# Patient Record
Sex: Female | Born: 1950
Health system: Southern US, Community
[De-identification: ages and names within clinical notes are randomized; demographics above are authoritative.]

## PROBLEM LIST (undated history)

## (undated) DIAGNOSIS — E119 Type 2 diabetes mellitus without complications: Secondary | ICD-10-CM

## (undated) DIAGNOSIS — E785 Hyperlipidemia, unspecified: Secondary | ICD-10-CM

## (undated) HISTORY — PX: ROTATOR CUFF REPAIR: SHX139

## (undated) HISTORY — PX: BREAST BIOPSY: SHX20

## (undated) HISTORY — PX: ABDOMINAL HYSTERECTOMY: SHX81

## (undated) HISTORY — PX: APPENDECTOMY: SHX54

## (undated) HISTORY — DX: Hyperlipidemia, unspecified: E78.5

## (undated) HISTORY — PX: TONSILLECTOMY: SUR1361

---

## 2002-05-18 ENCOUNTER — Encounter: Admission: RE | Admit: 2002-05-18 | Discharge: 2002-05-18 | Payer: Self-pay | Admitting: Obstetrics and Gynecology

## 2002-05-18 ENCOUNTER — Encounter: Payer: Self-pay | Admitting: Obstetrics and Gynecology

## 2005-12-05 ENCOUNTER — Emergency Department (HOSPITAL_COMMUNITY): Admission: EM | Admit: 2005-12-05 | Discharge: 2005-12-05 | Payer: Self-pay | Admitting: Family Medicine

## 2007-01-16 ENCOUNTER — Encounter: Admission: RE | Admit: 2007-01-16 | Discharge: 2007-01-16 | Payer: Self-pay | Admitting: *Deleted

## 2007-07-21 ENCOUNTER — Emergency Department (HOSPITAL_COMMUNITY): Admission: EM | Admit: 2007-07-21 | Discharge: 2007-07-21 | Payer: Self-pay | Admitting: Emergency Medicine

## 2009-06-29 ENCOUNTER — Encounter: Admission: RE | Admit: 2009-06-29 | Discharge: 2009-06-29 | Payer: Self-pay | Admitting: Family Medicine

## 2011-06-15 ENCOUNTER — Other Ambulatory Visit: Payer: Self-pay | Admitting: Family Medicine

## 2011-06-15 DIAGNOSIS — Z1231 Encounter for screening mammogram for malignant neoplasm of breast: Secondary | ICD-10-CM

## 2011-06-21 ENCOUNTER — Ambulatory Visit
Admission: RE | Admit: 2011-06-21 | Discharge: 2011-06-21 | Disposition: A | Discharge status: Home / Self Care | Payer: BC Managed Care – PPO | Source: Ambulatory Visit | Attending: Family Medicine | Admitting: Family Medicine

## 2011-06-21 DIAGNOSIS — Z1231 Encounter for screening mammogram for malignant neoplasm of breast: Secondary | ICD-10-CM

## 2013-02-25 ENCOUNTER — Other Ambulatory Visit: Payer: Self-pay

## 2013-02-25 DIAGNOSIS — Z1231 Encounter for screening mammogram for malignant neoplasm of breast: Secondary | ICD-10-CM

## 2013-03-09 ENCOUNTER — Ambulatory Visit
Admission: RE | Admit: 2013-03-09 | Discharge: 2013-03-09 | Disposition: A | Discharge status: Home / Self Care | Payer: BC Managed Care – PPO | Source: Ambulatory Visit

## 2013-03-09 DIAGNOSIS — Z1231 Encounter for screening mammogram for malignant neoplasm of breast: Secondary | ICD-10-CM

## 2013-06-11 ENCOUNTER — Other Ambulatory Visit: Payer: Self-pay | Admitting: Family Medicine

## 2013-06-11 DIAGNOSIS — N63 Unspecified lump in unspecified breast: Secondary | ICD-10-CM

## 2013-06-23 ENCOUNTER — Ambulatory Visit
Admission: RE | Admit: 2013-06-23 | Discharge: 2013-06-23 | Disposition: A | Discharge status: Home / Self Care | Payer: BC Managed Care – PPO | Source: Ambulatory Visit | Attending: Family Medicine | Admitting: Family Medicine

## 2013-06-23 DIAGNOSIS — N63 Unspecified lump in unspecified breast: Secondary | ICD-10-CM

## 2015-01-19 ENCOUNTER — Encounter: Payer: Self-pay | Admitting: Podiatrist

## 2015-01-19 ENCOUNTER — Ambulatory Visit (INDEPENDENT_AMBULATORY_CARE_PROVIDER_SITE_OTHER): Payer: 59 | Admitting: Podiatrist

## 2015-01-19 VITALS — BP 153/77 | HR 76 | Resp 12

## 2015-01-19 DIAGNOSIS — B351 Tinea unguium: Secondary | ICD-10-CM

## 2015-01-19 MED ORDER — EFINACONAZOLE 10 % EX SOLN: 1.0000 [drp] | Freq: Every day | CUTANEOUS | Status: AC

## 2015-01-19 NOTE — Patient Instructions (Signed)

## 2015-01-19 NOTE — Progress Notes (Signed)
   Subjective:    Patient ID: Susan Blackburn, female    DOB: January 24, 1951, 64 y.o.   MRN: [id number]  HPI PT STATED RT FOOT GREAT TOENAIL HAVE DISCOLORATION AND SOMETIMES IS SORE FOR 7 YEARS. THE TOENAIL IS BEEN THE SAME AND SOMETIMES IS WORSE. TRIED OTC ANTIFUNGAL BUT NO HELP. She previously had the toenail lasered by Dr. Janus Molder and she states that it grew back with the fungus still present. She would like to know her options.  Review of Systems  All other systems reviewed and are negative.      Objective:   Physical Exam  Neurovascular status is intact. Right great toenail is yellowish brownish discoloration, thickened and dystrophic-appearing. Medial aspect of the right second toenail also appears to have some mycotic appearance is well.      Assessment & Plan:  Onychomycosis right hallux nail  Plan: Recommended taking a sample of the nail for pathology and deciding upon treatment at that time. Did recommend starting on Jublia and a prescription was written. We'll consider oral options based on culture results.

## 2015-02-10 ENCOUNTER — Other Ambulatory Visit: Payer: Self-pay

## 2015-02-10 DIAGNOSIS — Z1231 Encounter for screening mammogram for malignant neoplasm of breast: Secondary | ICD-10-CM

## 2015-02-17 ENCOUNTER — Telehealth: Payer: Self-pay | Admitting: *Deleted

## 2015-02-17 ENCOUNTER — Ambulatory Visit
Admission: RE | Admit: 2015-02-17 | Discharge: 2015-02-17 | Disposition: A | Discharge status: Home / Self Care | Payer: 59 | Source: Ambulatory Visit

## 2015-02-17 ENCOUNTER — Encounter: Payer: Self-pay | Admitting: Podiatrist

## 2015-02-17 DIAGNOSIS — Z1231 Encounter for screening mammogram for malignant neoplasm of breast: Secondary | ICD-10-CM

## 2015-02-17 NOTE — Telephone Encounter (Signed)
I left a message for patient to give me a call back.  Per Dr. Valentina Lucks, culture came back positive for fungus. Patient can continue topical and a prescription for Lamisil can be prescribed which will be most effective.

## 2015-05-19 NOTE — Telephone Encounter (Signed)
I left messages for patient to give me a call back.  I need to inform her of culture results.

## 2015-11-02 ENCOUNTER — Other Ambulatory Visit: Payer: Self-pay | Admitting: Family Medicine

## 2015-11-02 DIAGNOSIS — N63 Unspecified lump in unspecified breast: Secondary | ICD-10-CM

## 2015-11-10 ENCOUNTER — Ambulatory Visit
Admission: RE | Admit: 2015-11-10 | Discharge: 2015-11-10 | Disposition: A | Discharge status: Home / Self Care | Payer: 59 | Source: Ambulatory Visit | Attending: Family Medicine | Admitting: Family Medicine

## 2015-11-10 DIAGNOSIS — N63 Unspecified lump in unspecified breast: Secondary | ICD-10-CM

## 2016-04-20 ENCOUNTER — Other Ambulatory Visit: Payer: Self-pay

## 2016-04-20 DIAGNOSIS — Z1231 Encounter for screening mammogram for malignant neoplasm of breast: Secondary | ICD-10-CM

## 2016-07-02 ENCOUNTER — Ambulatory Visit
Admission: RE | Admit: 2016-07-02 | Discharge: 2016-07-02 | Disposition: A | Discharge status: Home / Self Care | Payer: 59 | Source: Ambulatory Visit

## 2016-07-02 DIAGNOSIS — Z1231 Encounter for screening mammogram for malignant neoplasm of breast: Secondary | ICD-10-CM

## 2016-12-15 DIAGNOSIS — Z23 Encounter for immunization: Secondary | ICD-10-CM | POA: Diagnosis not present

## 2017-05-10 DIAGNOSIS — E782 Mixed hyperlipidemia: Secondary | ICD-10-CM | POA: Diagnosis not present

## 2017-05-10 DIAGNOSIS — R030 Elevated blood-pressure reading, without diagnosis of hypertension: Secondary | ICD-10-CM | POA: Diagnosis not present

## 2017-05-10 DIAGNOSIS — K219 Gastro-esophageal reflux disease without esophagitis: Secondary | ICD-10-CM | POA: Diagnosis not present

## 2017-05-10 DIAGNOSIS — Z23 Encounter for immunization: Secondary | ICD-10-CM | POA: Diagnosis not present

## 2017-05-10 DIAGNOSIS — Z85828 Personal history of other malignant neoplasm of skin: Secondary | ICD-10-CM | POA: Diagnosis not present

## 2017-05-10 DIAGNOSIS — Z1389 Encounter for screening for other disorder: Secondary | ICD-10-CM | POA: Diagnosis not present

## 2017-05-10 DIAGNOSIS — E559 Vitamin D deficiency, unspecified: Secondary | ICD-10-CM | POA: Diagnosis not present

## 2017-05-10 DIAGNOSIS — R7309 Other abnormal glucose: Secondary | ICD-10-CM | POA: Diagnosis not present

## 2017-05-10 DIAGNOSIS — R635 Abnormal weight gain: Secondary | ICD-10-CM | POA: Diagnosis not present

## 2017-05-10 DIAGNOSIS — Z833 Family history of diabetes mellitus: Secondary | ICD-10-CM | POA: Diagnosis not present

## 2017-05-10 DIAGNOSIS — M8588 Other specified disorders of bone density and structure, other site: Secondary | ICD-10-CM | POA: Diagnosis not present

## 2017-05-10 DIAGNOSIS — Z0000 Encounter for general adult medical examination without abnormal findings: Secondary | ICD-10-CM | POA: Diagnosis not present

## 2017-05-16 DIAGNOSIS — Z85828 Personal history of other malignant neoplasm of skin: Secondary | ICD-10-CM | POA: Diagnosis not present

## 2017-05-16 DIAGNOSIS — L570 Actinic keratosis: Secondary | ICD-10-CM | POA: Diagnosis not present

## 2017-05-16 DIAGNOSIS — L821 Other seborrheic keratosis: Secondary | ICD-10-CM | POA: Diagnosis not present

## 2017-05-16 DIAGNOSIS — D485 Neoplasm of uncertain behavior of skin: Secondary | ICD-10-CM | POA: Diagnosis not present

## 2017-05-16 DIAGNOSIS — D1801 Hemangioma of skin and subcutaneous tissue: Secondary | ICD-10-CM | POA: Diagnosis not present

## 2017-05-16 DIAGNOSIS — L814 Other melanin hyperpigmentation: Secondary | ICD-10-CM | POA: Diagnosis not present

## 2017-05-16 DIAGNOSIS — L439 Lichen planus, unspecified: Secondary | ICD-10-CM | POA: Diagnosis not present

## 2017-09-10 DIAGNOSIS — Z23 Encounter for immunization: Secondary | ICD-10-CM | POA: Diagnosis not present

## 2017-11-20 DIAGNOSIS — H2511 Age-related nuclear cataract, right eye: Secondary | ICD-10-CM | POA: Diagnosis not present

## 2017-11-20 DIAGNOSIS — H5213 Myopia, bilateral: Secondary | ICD-10-CM | POA: Diagnosis not present

## 2018-05-12 ENCOUNTER — Other Ambulatory Visit: Payer: Self-pay | Admitting: Family Medicine

## 2018-05-12 DIAGNOSIS — Z1231 Encounter for screening mammogram for malignant neoplasm of breast: Secondary | ICD-10-CM

## 2018-05-16 DIAGNOSIS — K219 Gastro-esophageal reflux disease without esophagitis: Secondary | ICD-10-CM | POA: Diagnosis not present

## 2018-05-16 DIAGNOSIS — Z1159 Encounter for screening for other viral diseases: Secondary | ICD-10-CM | POA: Diagnosis not present

## 2018-05-16 DIAGNOSIS — M8588 Other specified disorders of bone density and structure, other site: Secondary | ICD-10-CM | POA: Diagnosis not present

## 2018-05-16 DIAGNOSIS — R7303 Prediabetes: Secondary | ICD-10-CM | POA: Diagnosis not present

## 2018-05-16 DIAGNOSIS — Z136 Encounter for screening for cardiovascular disorders: Secondary | ICD-10-CM | POA: Diagnosis not present

## 2018-05-16 DIAGNOSIS — Z85828 Personal history of other malignant neoplasm of skin: Secondary | ICD-10-CM | POA: Diagnosis not present

## 2018-05-16 DIAGNOSIS — Z0000 Encounter for general adult medical examination without abnormal findings: Secondary | ICD-10-CM | POA: Diagnosis not present

## 2018-05-16 DIAGNOSIS — E559 Vitamin D deficiency, unspecified: Secondary | ICD-10-CM | POA: Diagnosis not present

## 2018-05-16 DIAGNOSIS — Z862 Personal history of diseases of the blood and blood-forming organs and certain disorders involving the immune mechanism: Secondary | ICD-10-CM | POA: Diagnosis not present

## 2018-05-16 DIAGNOSIS — E782 Mixed hyperlipidemia: Secondary | ICD-10-CM | POA: Diagnosis not present

## 2018-05-16 DIAGNOSIS — R609 Edema, unspecified: Secondary | ICD-10-CM | POA: Diagnosis not present

## 2018-05-16 DIAGNOSIS — Z1331 Encounter for screening for depression: Secondary | ICD-10-CM | POA: Diagnosis not present

## 2018-06-09 ENCOUNTER — Ambulatory Visit: Payer: 59

## 2018-07-07 ENCOUNTER — Ambulatory Visit
Admission: RE | Admit: 2018-07-07 | Discharge: 2018-07-07 | Disposition: A | Discharge status: Home / Self Care | Payer: Medicare Other | Source: Ambulatory Visit | Attending: Family Medicine | Admitting: Family Medicine

## 2018-07-07 DIAGNOSIS — Z1231 Encounter for screening mammogram for malignant neoplasm of breast: Secondary | ICD-10-CM | POA: Diagnosis not present

## 2018-08-07 DIAGNOSIS — D123 Benign neoplasm of transverse colon: Secondary | ICD-10-CM | POA: Diagnosis not present

## 2018-08-07 DIAGNOSIS — Z1211 Encounter for screening for malignant neoplasm of colon: Secondary | ICD-10-CM | POA: Diagnosis not present

## 2018-08-07 DIAGNOSIS — Z800 Family history of malignant neoplasm of digestive organs: Secondary | ICD-10-CM | POA: Diagnosis not present

## 2018-08-07 DIAGNOSIS — K5730 Diverticulosis of large intestine without perforation or abscess without bleeding: Secondary | ICD-10-CM | POA: Diagnosis not present

## 2018-08-12 DIAGNOSIS — D123 Benign neoplasm of transverse colon: Secondary | ICD-10-CM | POA: Diagnosis not present

## 2018-08-12 DIAGNOSIS — Z1211 Encounter for screening for malignant neoplasm of colon: Secondary | ICD-10-CM | POA: Diagnosis not present

## 2018-08-29 DIAGNOSIS — E782 Mixed hyperlipidemia: Secondary | ICD-10-CM | POA: Diagnosis not present

## 2018-08-29 DIAGNOSIS — R7303 Prediabetes: Secondary | ICD-10-CM | POA: Diagnosis not present

## 2018-08-29 DIAGNOSIS — E559 Vitamin D deficiency, unspecified: Secondary | ICD-10-CM | POA: Diagnosis not present

## 2018-09-04 DIAGNOSIS — Z23 Encounter for immunization: Secondary | ICD-10-CM | POA: Diagnosis not present

## 2018-10-02 DIAGNOSIS — M8588 Other specified disorders of bone density and structure, other site: Secondary | ICD-10-CM | POA: Diagnosis not present

## 2018-10-17 ENCOUNTER — Encounter: Payer: Medicare Other | Attending: Family Medicine | Admitting: Registered"

## 2018-10-17 ENCOUNTER — Encounter: Payer: Self-pay | Admitting: Registered"

## 2018-10-17 DIAGNOSIS — Z713 Dietary counseling and surveillance: Secondary | ICD-10-CM | POA: Insufficient documentation

## 2018-10-17 DIAGNOSIS — E119 Type 2 diabetes mellitus without complications: Secondary | ICD-10-CM | POA: Insufficient documentation

## 2018-10-17 NOTE — Patient Instructions (Addendum)
Consider going to bed earlier, catch the melatonin wave Consider getting in 1 or 2 days more of exercise Consider drinking more water. Get up more often when at work. Consider adding protein to your smoothies If you are going to have some ice cream, have closer to dinner and less carbs in the meal. Continue with the reduction in carbohydrates like you're doing.

## 2018-10-17 NOTE — Progress Notes (Signed)
Diabetes Self-Management Education  Visit Type: First/Initial  Appt. Start Time: 1025 Appt. End Time: 1130  10/17/2018  [name], identified by name and date of birth, is a 67 y.o. female with a diagnosis of Diabetes: Type 2.   ASSESSMENT Pt states she has kept her A1c in the prediabetes range for a long time and would like help in being able to reduce A1c below the diabetes range. Patient had questions about DM medications, but would like to avoid having to take meds for DM.  Pt states prior to diagnosis she was drinking more Dr. Malachi Bonds and eating ice cream 2-3x per week. Since diagnosis she has reduced her ice cream intake and cut out dr pepper and pasta.  Sleep: 5-6 hours weekdays, 7-8 on weekends. Doesn't want to get up in morning to go to work, sleeps in on weekends. Pt states since college she got in the habit of staying up late at night.  Stress level: 3-4/10 was high when husband had cancer and passed away 7 yrs ago. Pt is sad about her daughter and grandchildren moving to [place]. 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  Diabetes Self-Management Education - 10/17/18 1029      Visit Information   Visit Type  First/Initial      Initial Visit   Diabetes Type  Type 2    Are you currently following a meal plan?  No    Are you taking your medications as prescribed?  Not on Medications    Date Diagnosed  2019      Health Coping   How would you rate your overall health?  Good      Psychosocial Assessment   Patient Belief/Attitude about Diabetes  Motivated to manage diabetes    How often do you need to have someone help you when you read instructions, pamphlets, or other written materials from your doctor or pharmacy?  1 - Never    What is the last grade level you completed in school?  [employer]      Complications   Last HgB A1C per patient/outside source  6.6 %    How often do you check  your blood sugar?  0 times/day (not testing)    Have you had a dilated eye exam in the past 12 months?  Yes    Have you had a dental exam in the past 12 months?  Yes    Are you checking your feet?  No      Dietary Intake   Breakfast  granola bar, premier protein     Snack (morning)  none    Lunch  soup in a thermose, gold fish    Snack (afternoon)  when energy is low aroudn 3:30 or 4; tomatoes & cucumbers or rice cakes    Dinner  good - chicken or salmon, broccoli or cauliflower rice or salad, almond milk    Snack (evening)  ice cream 2-3x in last month.    Beverage(s)  water, almond milk, protein drink, hot chocolate when cold in office      Exercise   Exercise Type  Light (walking / raking leaves)    How many days per week to you exercise?  2    How many minutes per day do you exercise?  45    Total minutes per week of exercise  90      Patient Education  Previous Diabetes Education  No    Disease state   Definition of diabetes, type 1 and 2, and the diagnosis of diabetes    Nutrition management   Role of diet in the treatment of diabetes and the relationship between the three main macronutrients and blood glucose level;Carbohydrate counting;Food label reading, portion sizes and measuring food.    Physical activity and exercise   Role of exercise on diabetes management, blood pressure control and cardiac health.    Medications  Other (comment)   answered questons about metformin and insulin   Psychosocial adjustment  Role of stress on diabetes      Individualized Goals (developed by patient)   Nutrition  General guidelines for healthy choices and portions discussed    Physical Activity  Exercise 3-5 times per week    Reducing Risk  --   get 7-9 hours sleep     Outcomes   Expected Outcomes  Demonstrated interest in learning. Expect positive outcomes    Future DMSE  PRN    Program Status  Completed      Individualized Plan for Diabetes Self-Management Training:   Learning  Objective:  Patient will have a greater understanding of diabetes self-management. Patient education plan is to attend individual and/or group sessions per assessed needs and concerns.   Patient Instructions  Consider going to bed earlier, catch the melatonin wave Consider getting in 1 or 2 days more of exercise Consider drinking more water. Get up more often when at work. Consider adding protein to your smoothies If you are going to have some ice cream, have closer to dinner and less carbs in the meal. Continue with the reduction in carbohydrates like you're doing.  Expected Outcomes:  Demonstrated interest in learning. Expect positive outcomes  Education material provided: A1C conversion sheet and My Plate, sleep hygiene  If problems or questions, patient to contact team via:  Phone  Future DSME appointment: PRN

## 2019-01-02 DIAGNOSIS — H5213 Myopia, bilateral: Secondary | ICD-10-CM | POA: Diagnosis not present

## 2019-01-02 DIAGNOSIS — Z961 Presence of intraocular lens: Secondary | ICD-10-CM | POA: Diagnosis not present

## 2019-01-02 DIAGNOSIS — H2511 Age-related nuclear cataract, right eye: Secondary | ICD-10-CM | POA: Diagnosis not present

## 2019-01-07 DIAGNOSIS — J209 Acute bronchitis, unspecified: Secondary | ICD-10-CM | POA: Diagnosis not present

## 2019-05-22 DIAGNOSIS — Z0000 Encounter for general adult medical examination without abnormal findings: Secondary | ICD-10-CM | POA: Diagnosis not present

## 2019-05-22 DIAGNOSIS — Z6841 Body Mass Index (BMI) 40.0 and over, adult: Secondary | ICD-10-CM | POA: Diagnosis not present

## 2019-05-28 DIAGNOSIS — E559 Vitamin D deficiency, unspecified: Secondary | ICD-10-CM | POA: Diagnosis not present

## 2019-05-28 DIAGNOSIS — E119 Type 2 diabetes mellitus without complications: Secondary | ICD-10-CM | POA: Diagnosis not present

## 2019-05-28 DIAGNOSIS — E782 Mixed hyperlipidemia: Secondary | ICD-10-CM | POA: Diagnosis not present

## 2019-06-01 DIAGNOSIS — E782 Mixed hyperlipidemia: Secondary | ICD-10-CM | POA: Diagnosis not present

## 2019-06-01 DIAGNOSIS — E559 Vitamin D deficiency, unspecified: Secondary | ICD-10-CM | POA: Diagnosis not present

## 2019-06-01 DIAGNOSIS — K219 Gastro-esophageal reflux disease without esophagitis: Secondary | ICD-10-CM | POA: Diagnosis not present

## 2019-08-31 DIAGNOSIS — Z23 Encounter for immunization: Secondary | ICD-10-CM | POA: Diagnosis not present

## 2020-01-08 DIAGNOSIS — H2511 Age-related nuclear cataract, right eye: Secondary | ICD-10-CM | POA: Diagnosis not present

## 2020-01-08 DIAGNOSIS — H524 Presbyopia: Secondary | ICD-10-CM | POA: Diagnosis not present

## 2020-06-27 ENCOUNTER — Other Ambulatory Visit: Payer: Self-pay | Admitting: Family Medicine

## 2020-06-27 DIAGNOSIS — Z1231 Encounter for screening mammogram for malignant neoplasm of breast: Secondary | ICD-10-CM

## 2020-07-07 ENCOUNTER — Ambulatory Visit
Admission: RE | Admit: 2020-07-07 | Discharge: 2020-07-07 | Disposition: A | Discharge status: Home / Self Care | Payer: Medicare Other | Source: Ambulatory Visit | Attending: Family Medicine | Admitting: Family Medicine

## 2020-07-07 ENCOUNTER — Other Ambulatory Visit: Payer: Self-pay

## 2020-07-07 DIAGNOSIS — Z1231 Encounter for screening mammogram for malignant neoplasm of breast: Secondary | ICD-10-CM | POA: Diagnosis not present

## 2020-09-30 DIAGNOSIS — E559 Vitamin D deficiency, unspecified: Secondary | ICD-10-CM | POA: Diagnosis not present

## 2020-09-30 DIAGNOSIS — Z862 Personal history of diseases of the blood and blood-forming organs and certain disorders involving the immune mechanism: Secondary | ICD-10-CM | POA: Diagnosis not present

## 2020-09-30 DIAGNOSIS — E782 Mixed hyperlipidemia: Secondary | ICD-10-CM | POA: Diagnosis not present

## 2020-09-30 DIAGNOSIS — E1169 Type 2 diabetes mellitus with other specified complication: Secondary | ICD-10-CM | POA: Diagnosis not present

## 2020-10-06 DIAGNOSIS — Z23 Encounter for immunization: Secondary | ICD-10-CM | POA: Diagnosis not present

## 2020-10-06 DIAGNOSIS — Z0000 Encounter for general adult medical examination without abnormal findings: Secondary | ICD-10-CM | POA: Diagnosis not present

## 2020-10-06 DIAGNOSIS — E559 Vitamin D deficiency, unspecified: Secondary | ICD-10-CM | POA: Diagnosis not present

## 2020-10-06 DIAGNOSIS — Z1389 Encounter for screening for other disorder: Secondary | ICD-10-CM | POA: Diagnosis not present

## 2020-10-06 DIAGNOSIS — E782 Mixed hyperlipidemia: Secondary | ICD-10-CM | POA: Diagnosis not present

## 2020-10-06 DIAGNOSIS — K219 Gastro-esophageal reflux disease without esophagitis: Secondary | ICD-10-CM | POA: Diagnosis not present

## 2020-10-06 DIAGNOSIS — E1169 Type 2 diabetes mellitus with other specified complication: Secondary | ICD-10-CM | POA: Diagnosis not present

## 2020-10-27 DIAGNOSIS — Z79899 Other long term (current) drug therapy: Secondary | ICD-10-CM | POA: Diagnosis not present

## 2020-11-10 DIAGNOSIS — Z23 Encounter for immunization: Secondary | ICD-10-CM | POA: Diagnosis not present

## 2021-01-03 DIAGNOSIS — Z01812 Encounter for preprocedural laboratory examination: Secondary | ICD-10-CM | POA: Diagnosis not present

## 2021-01-06 DIAGNOSIS — D120 Benign neoplasm of cecum: Secondary | ICD-10-CM | POA: Diagnosis not present

## 2021-01-06 DIAGNOSIS — Z86010 Personal history of colonic polyps: Secondary | ICD-10-CM | POA: Diagnosis not present

## 2021-01-06 DIAGNOSIS — K5730 Diverticulosis of large intestine without perforation or abscess without bleeding: Secondary | ICD-10-CM | POA: Diagnosis not present

## 2021-01-06 DIAGNOSIS — K648 Other hemorrhoids: Secondary | ICD-10-CM | POA: Diagnosis not present

## 2021-01-10 DIAGNOSIS — D120 Benign neoplasm of cecum: Secondary | ICD-10-CM | POA: Diagnosis not present

## 2021-01-13 DIAGNOSIS — H2511 Age-related nuclear cataract, right eye: Secondary | ICD-10-CM | POA: Diagnosis not present

## 2021-01-13 DIAGNOSIS — H5213 Myopia, bilateral: Secondary | ICD-10-CM | POA: Diagnosis not present

## 2021-02-01 DIAGNOSIS — D239 Other benign neoplasm of skin, unspecified: Secondary | ICD-10-CM | POA: Diagnosis not present

## 2021-02-01 DIAGNOSIS — L820 Inflamed seborrheic keratosis: Secondary | ICD-10-CM | POA: Diagnosis not present

## 2021-03-10 DIAGNOSIS — Z23 Encounter for immunization: Secondary | ICD-10-CM | POA: Diagnosis not present

## 2021-10-05 ENCOUNTER — Other Ambulatory Visit: Payer: Self-pay | Admitting: Family Medicine

## 2021-10-05 DIAGNOSIS — Z1231 Encounter for screening mammogram for malignant neoplasm of breast: Secondary | ICD-10-CM

## 2021-10-06 ENCOUNTER — Other Ambulatory Visit: Payer: Self-pay

## 2021-10-06 ENCOUNTER — Ambulatory Visit
Admission: RE | Admit: 2021-10-06 | Discharge: 2021-10-06 | Disposition: A | Discharge status: Home / Self Care | Payer: Medicare Other | Source: Ambulatory Visit | Attending: Family Medicine | Admitting: Family Medicine

## 2021-10-06 DIAGNOSIS — Z1231 Encounter for screening mammogram for malignant neoplasm of breast: Secondary | ICD-10-CM | POA: Diagnosis not present

## 2021-10-13 DIAGNOSIS — Z79899 Other long term (current) drug therapy: Secondary | ICD-10-CM | POA: Diagnosis not present

## 2021-10-13 DIAGNOSIS — E559 Vitamin D deficiency, unspecified: Secondary | ICD-10-CM | POA: Diagnosis not present

## 2021-10-13 DIAGNOSIS — E1169 Type 2 diabetes mellitus with other specified complication: Secondary | ICD-10-CM | POA: Diagnosis not present

## 2021-10-13 DIAGNOSIS — Z1389 Encounter for screening for other disorder: Secondary | ICD-10-CM | POA: Diagnosis not present

## 2021-10-13 DIAGNOSIS — Z23 Encounter for immunization: Secondary | ICD-10-CM | POA: Diagnosis not present

## 2021-10-13 DIAGNOSIS — E782 Mixed hyperlipidemia: Secondary | ICD-10-CM | POA: Diagnosis not present

## 2021-10-13 DIAGNOSIS — Z0000 Encounter for general adult medical examination without abnormal findings: Secondary | ICD-10-CM | POA: Diagnosis not present

## 2022-01-19 DIAGNOSIS — H5213 Myopia, bilateral: Secondary | ICD-10-CM | POA: Diagnosis not present

## 2022-01-19 DIAGNOSIS — E119 Type 2 diabetes mellitus without complications: Secondary | ICD-10-CM | POA: Diagnosis not present

## 2022-01-19 DIAGNOSIS — H2511 Age-related nuclear cataract, right eye: Secondary | ICD-10-CM | POA: Diagnosis not present

## 2022-02-15 DIAGNOSIS — E1169 Type 2 diabetes mellitus with other specified complication: Secondary | ICD-10-CM | POA: Diagnosis not present

## 2022-09-12 DIAGNOSIS — Z23 Encounter for immunization: Secondary | ICD-10-CM | POA: Diagnosis not present

## 2022-10-22 DIAGNOSIS — E782 Mixed hyperlipidemia: Secondary | ICD-10-CM | POA: Diagnosis not present

## 2022-10-22 DIAGNOSIS — Z0000 Encounter for general adult medical examination without abnormal findings: Secondary | ICD-10-CM | POA: Diagnosis not present

## 2022-10-22 DIAGNOSIS — E1169 Type 2 diabetes mellitus with other specified complication: Secondary | ICD-10-CM | POA: Diagnosis not present

## 2022-10-23 ENCOUNTER — Other Ambulatory Visit: Payer: Self-pay | Admitting: Family Medicine

## 2022-10-23 DIAGNOSIS — Z1231 Encounter for screening mammogram for malignant neoplasm of breast: Secondary | ICD-10-CM

## 2022-10-26 DIAGNOSIS — M8588 Other specified disorders of bone density and structure, other site: Secondary | ICD-10-CM | POA: Diagnosis not present

## 2022-10-26 DIAGNOSIS — Z0000 Encounter for general adult medical examination without abnormal findings: Secondary | ICD-10-CM | POA: Diagnosis not present

## 2022-10-26 DIAGNOSIS — Z1389 Encounter for screening for other disorder: Secondary | ICD-10-CM | POA: Diagnosis not present

## 2022-10-26 DIAGNOSIS — E1169 Type 2 diabetes mellitus with other specified complication: Secondary | ICD-10-CM | POA: Diagnosis not present

## 2022-10-26 DIAGNOSIS — E782 Mixed hyperlipidemia: Secondary | ICD-10-CM | POA: Diagnosis not present

## 2022-10-26 DIAGNOSIS — Z23 Encounter for immunization: Secondary | ICD-10-CM | POA: Diagnosis not present

## 2022-12-04 ENCOUNTER — Ambulatory Visit
Admission: RE | Admit: 2022-12-04 | Discharge: 2022-12-04 | Disposition: A | Discharge status: Home / Self Care | Payer: Medicare Other | Source: Ambulatory Visit | Attending: Family Medicine | Admitting: Family Medicine

## 2022-12-04 ENCOUNTER — Ambulatory Visit: Payer: Medicare Other

## 2022-12-04 DIAGNOSIS — Z1231 Encounter for screening mammogram for malignant neoplasm of breast: Secondary | ICD-10-CM

## 2022-12-06 ENCOUNTER — Other Ambulatory Visit: Payer: Self-pay | Admitting: Family Medicine

## 2022-12-06 DIAGNOSIS — R928 Other abnormal and inconclusive findings on diagnostic imaging of breast: Secondary | ICD-10-CM

## 2022-12-13 ENCOUNTER — Other Ambulatory Visit: Payer: Self-pay | Admitting: Family Medicine

## 2022-12-13 ENCOUNTER — Ambulatory Visit
Admission: RE | Admit: 2022-12-13 | Discharge: 2022-12-13 | Disposition: A | Discharge status: Home / Self Care | Payer: Medicare Other | Source: Ambulatory Visit | Attending: Family Medicine | Admitting: Family Medicine

## 2022-12-13 DIAGNOSIS — R928 Other abnormal and inconclusive findings on diagnostic imaging of breast: Secondary | ICD-10-CM

## 2022-12-13 DIAGNOSIS — N6489 Other specified disorders of breast: Secondary | ICD-10-CM

## 2022-12-27 ENCOUNTER — Ambulatory Visit
Admission: RE | Admit: 2022-12-27 | Discharge: 2022-12-27 | Disposition: A | Discharge status: Home / Self Care | Payer: Medicare Other | Source: Ambulatory Visit | Attending: Family Medicine | Admitting: Family Medicine

## 2022-12-27 DIAGNOSIS — N62 Hypertrophy of breast: Secondary | ICD-10-CM | POA: Diagnosis not present

## 2022-12-27 DIAGNOSIS — N6489 Other specified disorders of breast: Secondary | ICD-10-CM

## 2022-12-27 DIAGNOSIS — R928 Other abnormal and inconclusive findings on diagnostic imaging of breast: Secondary | ICD-10-CM | POA: Diagnosis not present

## 2022-12-27 HISTORY — PX: BREAST BIOPSY: SHX20

## 2023-01-18 ENCOUNTER — Other Ambulatory Visit: Payer: Self-pay | Admitting: Surgery

## 2023-01-18 DIAGNOSIS — N6489 Other specified disorders of breast: Secondary | ICD-10-CM | POA: Diagnosis not present

## 2023-01-30 ENCOUNTER — Other Ambulatory Visit: Payer: Self-pay | Admitting: Surgery

## 2023-01-30 DIAGNOSIS — N6489 Other specified disorders of breast: Secondary | ICD-10-CM

## 2023-02-01 DIAGNOSIS — H5213 Myopia, bilateral: Secondary | ICD-10-CM | POA: Diagnosis not present

## 2023-02-01 DIAGNOSIS — E119 Type 2 diabetes mellitus without complications: Secondary | ICD-10-CM | POA: Diagnosis not present

## 2023-02-01 DIAGNOSIS — H2511 Age-related nuclear cataract, right eye: Secondary | ICD-10-CM | POA: Diagnosis not present

## 2023-03-06 IMAGING — MG MM DIGITAL SCREENING BILAT W/ TOMO AND CAD
6 of 10 series · 6 of 30 positions shown · non-contrast
Comparison: Previous exam(s).

CLINICAL DATA: Screening.

EXAM:
DIGITAL SCREENING BILATERAL MAMMOGRAM WITH TOMOSYNTHESIS AND CAD
TECHNIQUE: Bilateral screening digital craniocaudal and mediolateral oblique
mammograms were obtained. Bilateral screening digital breast
tomosynthesis was performed. The images were evaluated with
computer-aided detection.

[L MLO synth-2D]
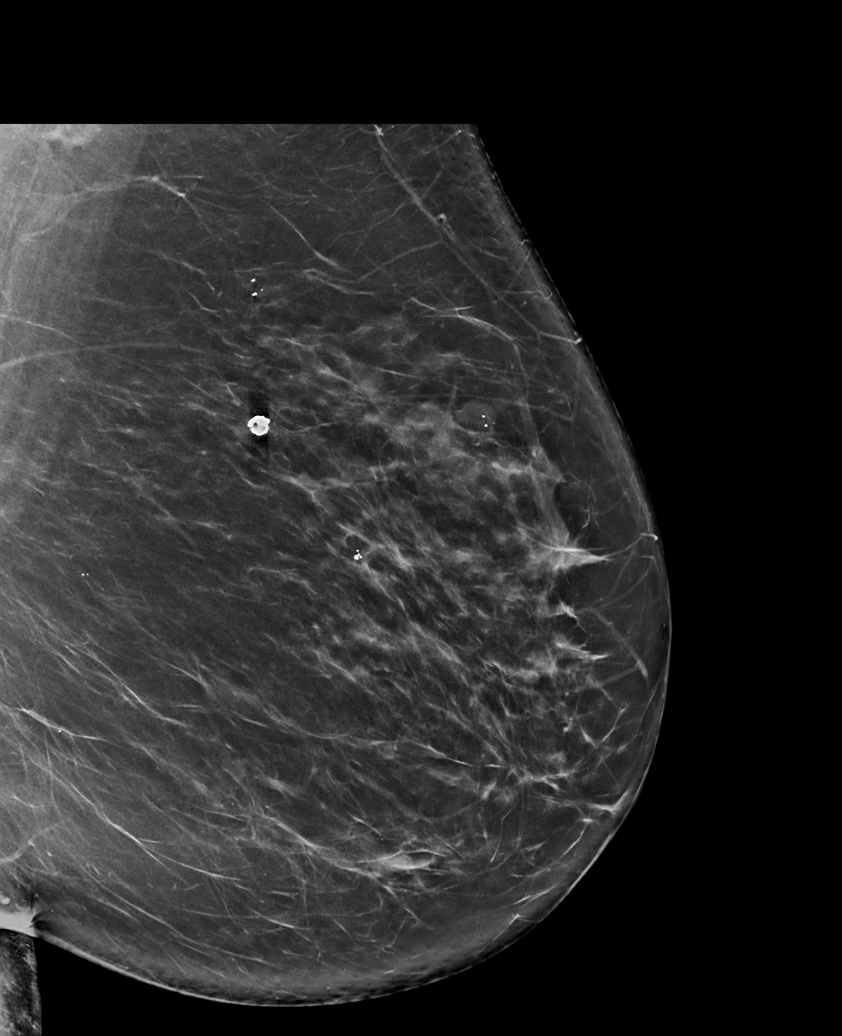

[L CC synth-2D]
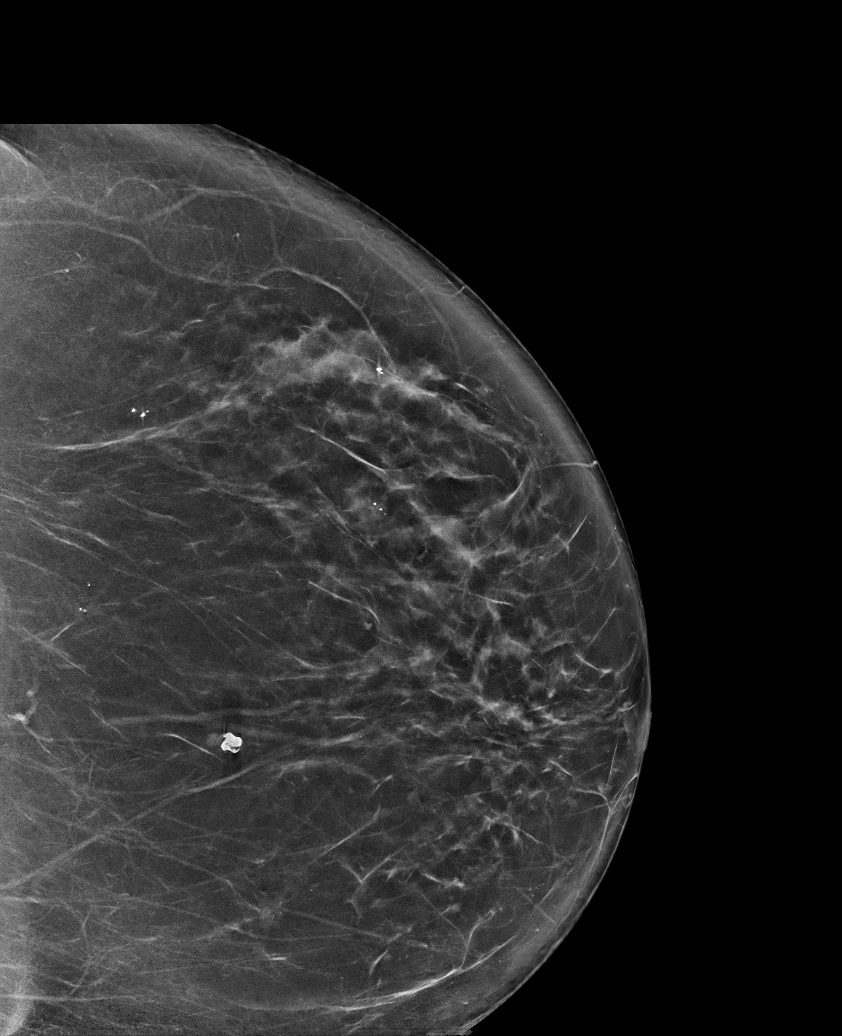

[R MLO synth-2D]
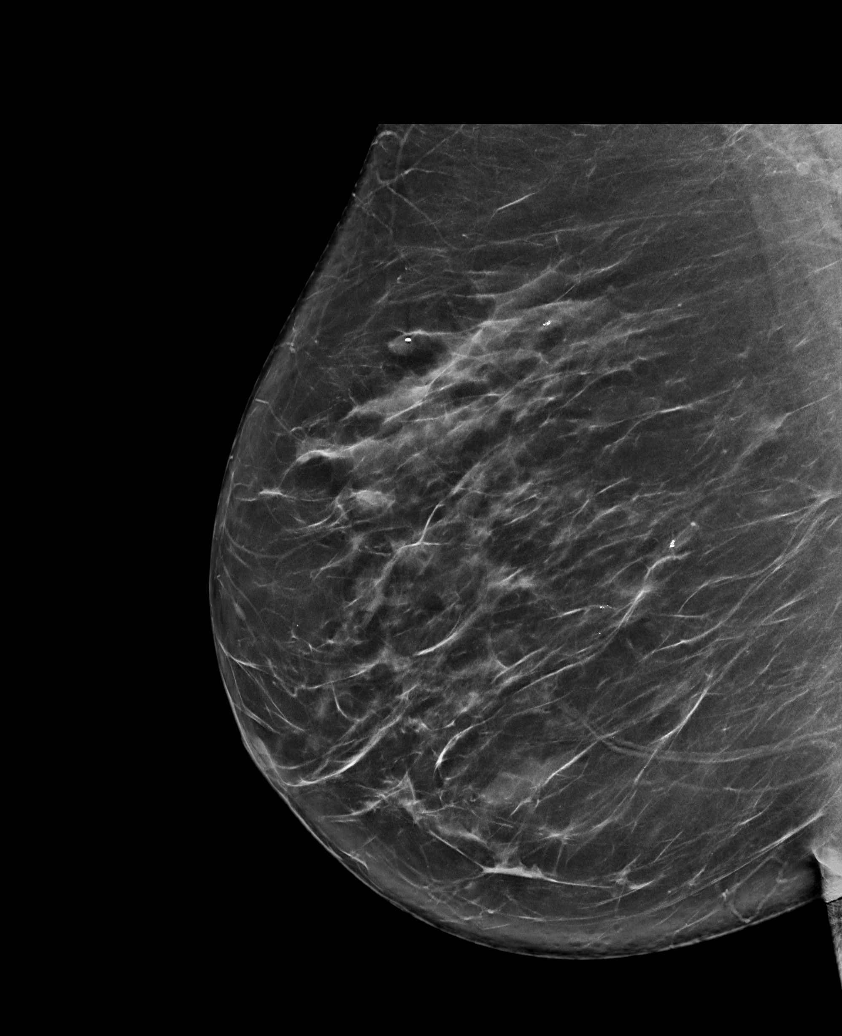

[R CC synth-2D]
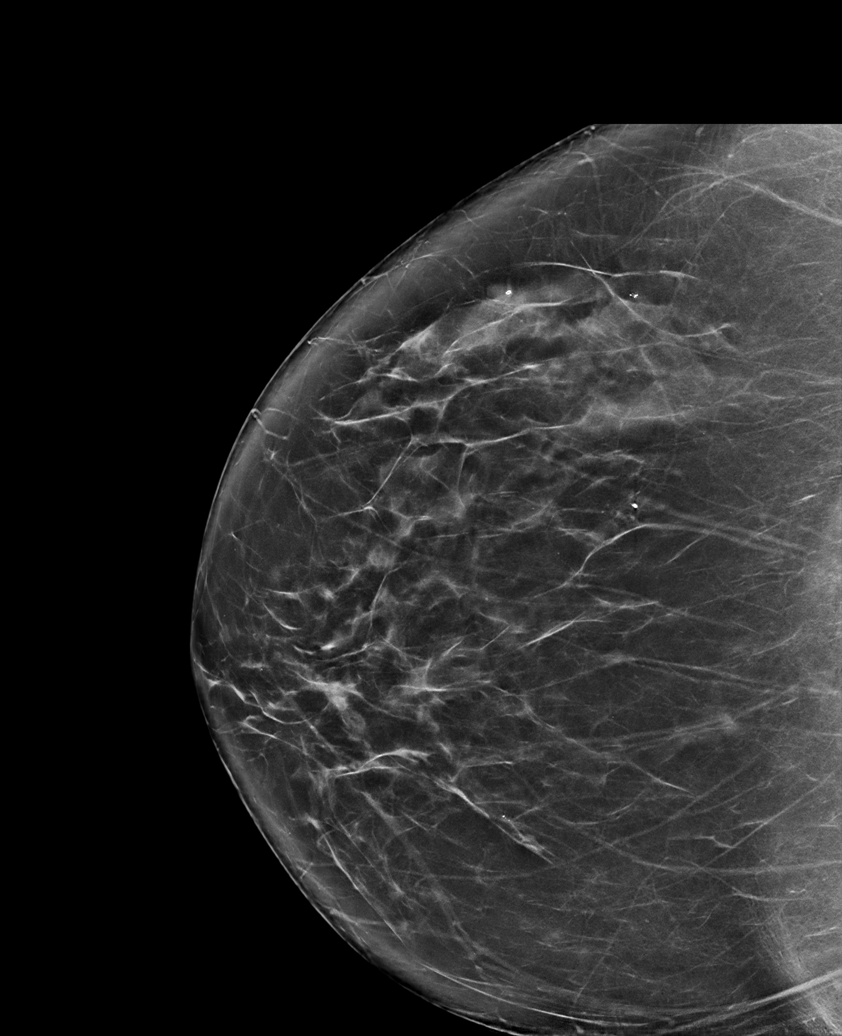

[R CV synth-2D]
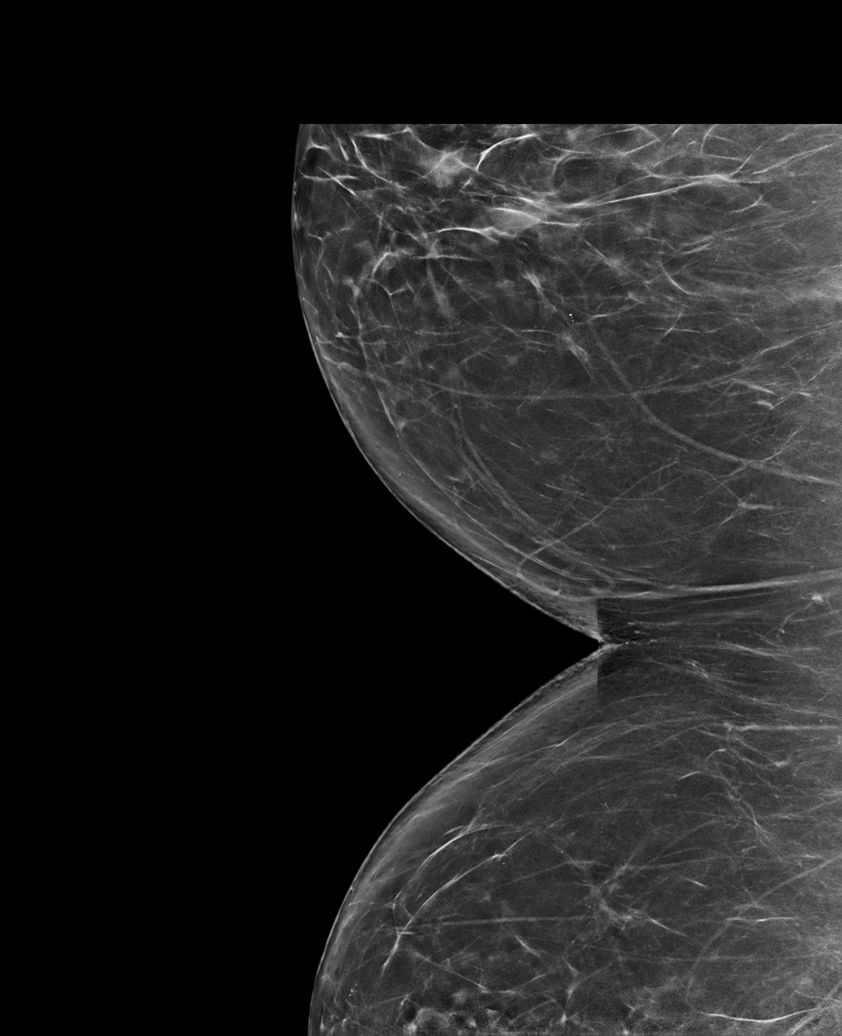

[L MLO tomo · tomo slice 47/93.0]
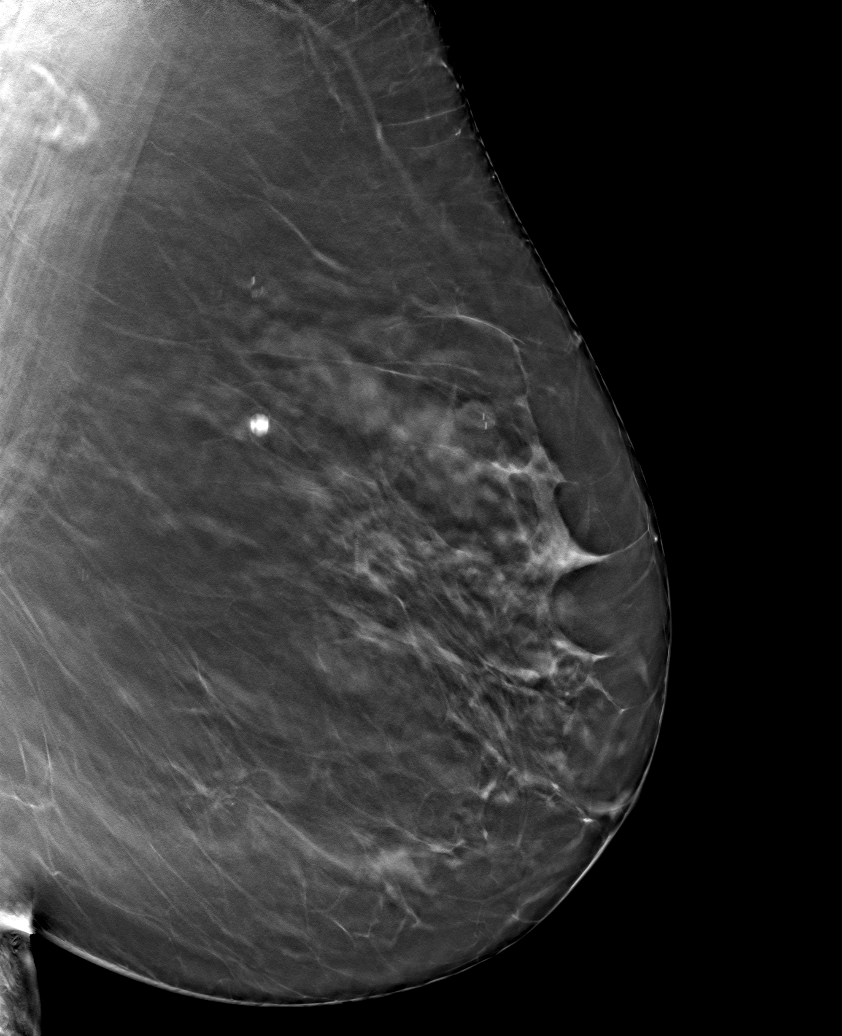

[6 of 30 positions shown; findings below may reference images not displayed]

ACR Breast Density Category b: There are scattered areas of
fibroglandular density.
FINDINGS: There are no findings suspicious for malignancy.
IMPRESSION: No mammographic evidence of malignancy. A result letter of this
screening mammogram will be mailed directly to the patient.

RECOMMENDATION:
Screening mammogram in one year. (Code:51-O-LD2)

BI-RADS CATEGORY  1: Negative.

## 2023-04-04 ENCOUNTER — Encounter (HOSPITAL_BASED_OUTPATIENT_CLINIC_OR_DEPARTMENT_OTHER): Payer: Self-pay | Admitting: Surgery

## 2023-04-04 ENCOUNTER — Other Ambulatory Visit: Payer: Self-pay

## 2023-04-05 ENCOUNTER — Encounter (HOSPITAL_BASED_OUTPATIENT_CLINIC_OR_DEPARTMENT_OTHER)
Admission: RE | Admit: 2023-04-05 | Discharge: 2023-04-05 | Disposition: A | Discharge status: Home / Self Care | Payer: Medicare Other | Source: Ambulatory Visit | Attending: Surgery | Admitting: Surgery

## 2023-04-05 DIAGNOSIS — Z01818 Encounter for other preprocedural examination: Secondary | ICD-10-CM | POA: Insufficient documentation

## 2023-04-05 LAB — BASIC METABOLIC PANEL
Anion gap: 7 (ref 5–15)
BUN: 27 mg/dL — ABNORMAL HIGH (ref 8–23)
CO2: 27 mmol/L (ref 22–32)
Calcium: 9.9 mg/dL (ref 8.9–10.3)
Chloride: 105 mmol/L (ref 98–111)
Creatinine, Ser: 0.71 mg/dL (ref 0.44–1.00)
GFR, Estimated: >60 mL/min (ref >60)
Glucose, Bld: 151 mg/dL — ABNORMAL HIGH (ref 70–99)
Potassium: 4.6 mmol/L (ref 3.5–5.1)
Sodium: 139 mmol/L (ref 135–145)

## 2023-04-05 MED ORDER — CHLORHEXIDINE GLUCONATE CLOTH 2 % EX PADS: 6.0000 | MEDICATED_PAD | Freq: Once | CUTANEOUS | Status: DC

## 2023-04-05 MED ORDER — ENSURE PRE-SURGERY PO LIQD: 296.0000 mL | Freq: Once | ORAL | Status: DC

## 2023-04-05 NOTE — Progress Notes (Signed)

## 2023-04-09 ENCOUNTER — Ambulatory Visit
Admission: RE | Admit: 2023-04-09 | Discharge: 2023-04-09 | Disposition: A | Discharge status: Home / Self Care | Payer: Medicare Other | Source: Ambulatory Visit | Attending: Surgery | Admitting: Surgery

## 2023-04-09 DIAGNOSIS — N6489 Other specified disorders of breast: Secondary | ICD-10-CM

## 2023-04-09 DIAGNOSIS — Z510 Encounter for antineoplastic radiation therapy: Secondary | ICD-10-CM | POA: Diagnosis not present

## 2023-04-09 HISTORY — PX: BREAST BIOPSY: SHX20

## 2023-04-09 NOTE — Anesthesia Preprocedure Evaluation (Signed)
Anesthesia Evaluation  Patient identified by MRN, date of birth, ID band Patient awake    Reviewed: Allergy & Precautions, H&P , NPO status , Patient's Chart, lab work & pertinent test results  Airway Mallampati: III  TM Distance: >3 FB Neck ROM: Full    Dental no notable dental hx. (+) Teeth Intact, Dental Advisory Given   Pulmonary neg pulmonary ROS   Pulmonary exam normal breath sounds clear to auscultation       Cardiovascular Exercise Tolerance: Good negative cardio ROS  Rhythm:Regular Rate:Normal     Neuro/Psych negative neurological ROS  negative psych ROS   GI/Hepatic negative GI ROS, Neg liver ROS,,,  Endo/Other  diabetes, Type 2, Oral Hypoglycemic Agents  Morbid obesity  Renal/GU negative Renal ROS  negative genitourinary   Musculoskeletal   Abdominal   Peds  Hematology negative hematology ROS (+)   Anesthesia Other Findings   Reproductive/Obstetrics negative OB ROS                             Anesthesia Physical Anesthesia Plan  ASA: 3  Anesthesia Plan: General   Post-op Pain Management: Tylenol PO (pre-op)*   Induction:   PONV Risk Score and Plan: 4 or greater and Ondansetron, Dexamethasone, Propofol infusion and TIVA  Airway Management Planned: LMA  Additional Equipment:   Intra-op Plan:   Post-operative Plan: Extubation in OR  Informed Consent: I have reviewed the patients History and Physical, chart, labs and discussed the procedure including the risks, benefits and alternatives for the proposed anesthesia with the patient or authorized representative who has indicated his/her understanding and acceptance.     Dental advisory given  Plan Discussed with: CRNA  Anesthesia Plan Comments:        Anesthesia Quick Evaluation

## 2023-04-09 NOTE — H&P (Signed)
  REFERRING PHYSICIAN: Sigmund Hazel, MD PROVIDER: Wayne Both, MD MRN: [id number] DOB: [birth year]  Subjective  Chief Complaint: New Consultation (Breast, Left )  History of Present Illness: Susan Blackburn is a 72 y.o. female who is seen as an office consultation for evaluation of New Consultation (Breast, Left )  This is a pleasant 72 year old female who was found to have a distortion on recent screening mammography of the left breast. An ultrasound showed no mass and no axillary adenopathy. She underwent a biopsy of the distortion showing a complex sclerosing lesion. She had no previous problems with her breast and has no family history of breast cancer. She is otherwise healthy and without complaints. She has had no previous issues with general anesthesia.  Review of Systems: A complete review of systems was obtained from the patient. I have reviewed this information and discussed as appropriate with the patient. See HPI as well for other ROS.  ROS  Medical History: Past Medical History: Diagnosis Date Arthritis Diabetes mellitus without complication (CMS-HCC) Hyperlipidemia  There is no problem list on file for this patient.  Past Surgical History: Procedure Laterality Date Biopsy Surgery 12/2022   No Known Allergies  Current Outpatient Medications on File Prior to Visit Medication Sig Dispense Refill atorvastatin (LIPITOR) 20 MG tablet Take 20 mg by mouth once daily famotidine (PEPCID) 20 MG tablet Take 20 mg by mouth 2 (two) times daily ibuprofen (MOTRIN) 200 MG tablet Take 200 mg by mouth every 6 (six) hours as needed metFORMIN (GLUCOPHAGE) 500 MG tablet 1 TABLET WITH A MEAL ORALLY TWICE A DAY  No current facility-administered medications on file prior to visit.  History reviewed. No pertinent family history.  Social History  Tobacco Use Smoking Status Never Smokeless Tobacco Never   Social History  Socioeconomic History Marital status:  Unknown Tobacco Use Smoking status: Never Smokeless tobacco: Never Substance and Sexual Activity Alcohol use: Not Currently Drug use: Never  Objective:  Vitals:  BP: (!) 175/90 Pulse: 101 Temp: 36.4 C (97.6 F) SpO2: 98% Weight: 96.6 kg (213 lb) Height: 162.6 cm (5\' 4" )  Body mass index is 36.56 kg/m.  Physical Exam  She appears well on exam  There are no palpable left breast masses and no axillary adenopathy. The nipple areolar complex is normal.  Labs, Imaging and Diagnostic Testing: I have reviewed her mammograms and ultrasound as well as her pathology results  Assessment and Plan:  Diagnoses and all orders for this visit:  Complex sclerosing lesion of left breast   I gave the patient and her daughter copy the pathology results. I discussed the findings of a complex sclerosing lesion in the left breast which is also sometimes called a radial scar. Although the pathology is benign, we discussed the potential to upgrade to an early malignancy therefore surgical removal of this area is recommended. I explained proceeding with a left breast radioactive seed guided lumpectomy. I described the surgical procedure in detail. We discussed the risks of surgery which includes but is not limited to bleeding, infection, injury to surrounding structures, the need for further surgery if malignancy is found, cardiopulmonary issues with anesthesia, postoperative recovery, etc. They understand and wish to proceed with surgery which will be scheduled

## 2023-04-10 ENCOUNTER — Ambulatory Visit (HOSPITAL_BASED_OUTPATIENT_CLINIC_OR_DEPARTMENT_OTHER): Payer: Medicare Other | Admitting: Certified Registered"

## 2023-04-10 ENCOUNTER — Encounter (HOSPITAL_BASED_OUTPATIENT_CLINIC_OR_DEPARTMENT_OTHER): Payer: Self-pay | Admitting: Surgery

## 2023-04-10 ENCOUNTER — Ambulatory Visit
Admission: RE | Admit: 2023-04-10 | Discharge: 2023-04-10 | Disposition: A | Discharge status: Home / Self Care | Payer: Medicare Other | Source: Ambulatory Visit | Attending: Surgery | Admitting: Surgery

## 2023-04-10 ENCOUNTER — Other Ambulatory Visit: Payer: Self-pay

## 2023-04-10 ENCOUNTER — Ambulatory Visit (HOSPITAL_BASED_OUTPATIENT_CLINIC_OR_DEPARTMENT_OTHER)
Admission: RE | Admit: 2023-04-10 | Discharge: 2023-04-10 | Disposition: A | Discharge status: Home / Self Care | Payer: Medicare Other | Attending: Surgery | Admitting: Surgery

## 2023-04-10 ENCOUNTER — Encounter (HOSPITAL_BASED_OUTPATIENT_CLINIC_OR_DEPARTMENT_OTHER)
Admission: RE | Disposition: A | Discharge status: Home / Self Care | Payer: Self-pay | Source: Home / Self Care | Attending: Surgery

## 2023-04-10 DIAGNOSIS — Z7984 Long term (current) use of oral hypoglycemic drugs: Secondary | ICD-10-CM | POA: Insufficient documentation

## 2023-04-10 DIAGNOSIS — N6022 Fibroadenosis of left breast: Secondary | ICD-10-CM | POA: Diagnosis not present

## 2023-04-10 DIAGNOSIS — Z6836 Body mass index (BMI) 36.0-36.9, adult: Secondary | ICD-10-CM | POA: Insufficient documentation

## 2023-04-10 DIAGNOSIS — E119 Type 2 diabetes mellitus without complications: Secondary | ICD-10-CM | POA: Insufficient documentation

## 2023-04-10 DIAGNOSIS — N6082 Other benign mammary dysplasias of left breast: Secondary | ICD-10-CM | POA: Diagnosis not present

## 2023-04-10 DIAGNOSIS — N6489 Other specified disorders of breast: Secondary | ICD-10-CM

## 2023-04-10 DIAGNOSIS — Z9889 Other specified postprocedural states: Secondary | ICD-10-CM | POA: Diagnosis not present

## 2023-04-10 DIAGNOSIS — Z01818 Encounter for other preprocedural examination: Secondary | ICD-10-CM

## 2023-04-10 HISTORY — PX: BREAST LUMPECTOMY WITH RADIOACTIVE SEED LOCALIZATION: SHX6424

## 2023-04-10 HISTORY — DX: Type 2 diabetes mellitus without complications: E11.9

## 2023-04-10 LAB — GLUCOSE, CAPILLARY
Glucose-Capillary: 143 mg/dL — ABNORMAL HIGH (ref 70–99)
Glucose-Capillary: 147 mg/dL — ABNORMAL HIGH (ref 70–99)

## 2023-04-10 SURGERY — BREAST LUMPECTOMY WITH RADIOACTIVE SEED LOCALIZATION
Anesthesia: General | Site: Breast | Laterality: Left

## 2023-04-10 MED ORDER — EPHEDRINE 5 MG/ML INJ
INTRAVENOUS | Status: AC
  Filled 2023-04-10: qty 5

## 2023-04-10 MED ORDER — FENTANYL CITRATE (PF) 100 MCG/2ML IJ SOLN
INTRAMUSCULAR | Status: AC
  Filled 2023-04-10: qty 2

## 2023-04-10 MED ORDER — CEFAZOLIN SODIUM-DEXTROSE 2-4 GM/100ML-% IV SOLN
2.0000 g | INTRAVENOUS | Status: AC
  Administered 2023-04-10: 2 g via INTRAVENOUS

## 2023-04-10 MED ORDER — ACETAMINOPHEN 500 MG PO TABS
1000.0000 mg | ORAL_TABLET | ORAL | Status: AC
  Administered 2023-04-10: 1000 mg via ORAL

## 2023-04-10 MED ORDER — EPHEDRINE SULFATE (PRESSORS) 50 MG/ML IJ SOLN
INTRAMUSCULAR | Status: DC | PRN
  Administered 2023-04-10 (×2): 5 mg via INTRAVENOUS

## 2023-04-10 MED ORDER — FENTANYL CITRATE (PF) 100 MCG/2ML IJ SOLN
INTRAMUSCULAR | Status: DC | PRN
  Administered 2023-04-10: 25 ug via INTRAVENOUS

## 2023-04-10 MED ORDER — ACETAMINOPHEN 500 MG PO TABS
ORAL_TABLET | ORAL | Status: AC
  Filled 2023-04-10: qty 2

## 2023-04-10 MED ORDER — LIDOCAINE 2% (20 MG/ML) 5 ML SYRINGE
INTRAMUSCULAR | Status: AC
  Filled 2023-04-10: qty 5

## 2023-04-10 MED ORDER — BUPIVACAINE-EPINEPHRINE 0.5% -1:200000 IJ SOLN
INTRAMUSCULAR | Status: DC | PRN
  Administered 2023-04-10: 20 mL

## 2023-04-10 MED ORDER — LIDOCAINE HCL (CARDIAC) PF 100 MG/5ML IV SOSY
PREFILLED_SYRINGE | INTRAVENOUS | Status: DC | PRN
  Administered 2023-04-10: 60 mg via INTRAVENOUS

## 2023-04-10 MED ORDER — CEFAZOLIN SODIUM-DEXTROSE 2-4 GM/100ML-% IV SOLN
INTRAVENOUS | Status: AC
  Filled 2023-04-10: qty 100

## 2023-04-10 MED ORDER — PROPOFOL 10 MG/ML IV BOLUS
INTRAVENOUS | Status: AC
  Filled 2023-04-10: qty 20

## 2023-04-10 MED ORDER — ONDANSETRON HCL 4 MG/2ML IJ SOLN
INTRAMUSCULAR | Status: AC
  Filled 2023-04-10: qty 2

## 2023-04-10 MED ORDER — FENTANYL CITRATE (PF) 100 MCG/2ML IJ SOLN: 25.0000 ug | INTRAMUSCULAR | Status: DC | PRN

## 2023-04-10 MED ORDER — ONDANSETRON HCL 4 MG/2ML IJ SOLN
INTRAMUSCULAR | Status: DC | PRN
  Administered 2023-04-10: 4 mg via INTRAVENOUS

## 2023-04-10 MED ORDER — LACTATED RINGERS IV SOLN: INTRAVENOUS | Status: DC

## 2023-04-10 MED ORDER — TRAMADOL HCL 50 MG PO TABS: 50.0000 mg | ORAL_TABLET | Freq: Four times a day (QID) | ORAL | 0 refills | Status: AC | PRN

## 2023-04-10 MED ORDER — DEXAMETHASONE SODIUM PHOSPHATE 10 MG/ML IJ SOLN
INTRAMUSCULAR | Status: AC
  Filled 2023-04-10: qty 1

## 2023-04-10 MED ORDER — PROPOFOL 10 MG/ML IV BOLUS
INTRAVENOUS | Status: DC | PRN
  Administered 2023-04-10: 120 mg via INTRAVENOUS

## 2023-04-10 MED ORDER — DEXAMETHASONE SODIUM PHOSPHATE 10 MG/ML IJ SOLN
INTRAMUSCULAR | Status: DC | PRN
  Administered 2023-04-10: 5 mg via INTRAVENOUS

## 2023-04-10 SURGICAL SUPPLY — 47 items
ADH SKN CLS APL DERMABOND .7 (GAUZE/BANDAGES/DRESSINGS) ×1
APL PRP STRL LF DISP 70% ISPRP (MISCELLANEOUS) ×1
APPLIER CLIP 9.375 MED OPEN (MISCELLANEOUS)
APR CLP MED 9.3 20 MLT OPN (MISCELLANEOUS)
BINDER BREAST 3XL (GAUZE/BANDAGES/DRESSINGS) IMPLANT
BINDER BREAST LRG (GAUZE/BANDAGES/DRESSINGS) IMPLANT
BINDER BREAST MEDIUM (GAUZE/BANDAGES/DRESSINGS) IMPLANT
BINDER BREAST XLRG (GAUZE/BANDAGES/DRESSINGS) IMPLANT
BINDER BREAST XXLRG (GAUZE/BANDAGES/DRESSINGS) IMPLANT
BLADE SURG 15 STRL LF DISP TIS (BLADE) ×1 IMPLANT
BLADE SURG 15 STRL SS (BLADE) ×1
CANISTER SUC SOCK COL 7IN (MISCELLANEOUS) IMPLANT
CANISTER SUCT 1200ML W/VALVE (MISCELLANEOUS) IMPLANT
CHLORAPREP W/TINT 26 (MISCELLANEOUS) ×1 IMPLANT
CLIP APPLIE 9.375 MED OPEN (MISCELLANEOUS) IMPLANT
COVER BACK TABLE 60X90IN (DRAPES) ×1 IMPLANT
COVER MAYO STAND STRL (DRAPES) ×1 IMPLANT
COVER PROBE CYLINDRICAL 5X96 (MISCELLANEOUS) ×1 IMPLANT
DERMABOND ADVANCED .7 DNX12 (GAUZE/BANDAGES/DRESSINGS) ×1 IMPLANT
DRAPE LAPAROSCOPIC ABDOMINAL (DRAPES) ×1 IMPLANT
DRAPE UTILITY XL STRL (DRAPES) ×1 IMPLANT
ELECT REM PT RETURN 9FT ADLT (ELECTROSURGICAL) ×1
ELECTRODE REM PT RTRN 9FT ADLT (ELECTROSURGICAL) ×1 IMPLANT
GAUZE SPONGE 4X4 12PLY STRL LF (GAUZE/BANDAGES/DRESSINGS) IMPLANT
GLOVE SURG SIGNA 7.5 PF LTX (GLOVE) ×1 IMPLANT
GOWN STRL REUS W/ TWL LRG LVL3 (GOWN DISPOSABLE) ×1 IMPLANT
GOWN STRL REUS W/ TWL XL LVL3 (GOWN DISPOSABLE) ×1 IMPLANT
GOWN STRL REUS W/TWL LRG LVL3 (GOWN DISPOSABLE) ×1
GOWN STRL REUS W/TWL XL LVL3 (GOWN DISPOSABLE) ×1
KIT MARKER MARGIN INK (KITS) ×1 IMPLANT
NDL HYPO 25X1 1.5 SAFETY (NEEDLE) ×1 IMPLANT
NEEDLE HYPO 25X1 1.5 SAFETY (NEEDLE) ×1 IMPLANT
NS IRRIG 1000ML POUR BTL (IV SOLUTION) IMPLANT
PACK BASIN DAY SURGERY FS (CUSTOM PROCEDURE TRAY) ×1 IMPLANT
PENCIL SMOKE EVACUATOR (MISCELLANEOUS) ×1 IMPLANT
SLEEVE SCD COMPRESS KNEE MED (STOCKING) ×1 IMPLANT
SPIKE FLUID TRANSFER (MISCELLANEOUS) IMPLANT
SPONGE T-LAP 4X18 ~~LOC~~+RFID (SPONGE) ×1 IMPLANT
SUT MNCRL AB 4-0 PS2 18 (SUTURE) ×1 IMPLANT
SUT SILK 2 0 SH (SUTURE) IMPLANT
SUT VIC AB 3-0 SH 27 (SUTURE) ×1
SUT VIC AB 3-0 SH 27X BRD (SUTURE) ×1 IMPLANT
SYR CONTROL 10ML LL (SYRINGE) ×1 IMPLANT
TOWEL GREEN STERILE FF (TOWEL DISPOSABLE) ×1 IMPLANT
TRAY FAXITRON CT DISP (TRAY / TRAY PROCEDURE) ×1 IMPLANT
TUBE CONNECTING 20X1/4 (TUBING) IMPLANT
YANKAUER SUCT BULB TIP NO VENT (SUCTIONS) IMPLANT

## 2023-04-10 NOTE — Op Note (Signed)
LEFT BREAST LUMPECTOMY WITH RADIOACTIVE SEED LOCALIZATION  Procedure Note  Susan Blackburn 04/10/2023   Pre-op Diagnosis: LEFT BREAST COMPLEX SCLEROSING LESION     Post-op Diagnosis: same  Procedure(s): LEFT BREAST LUMPECTOMY WITH RADIOACTIVE SEED LOCALIZATION  Surgeon(s): Abigail Miyamoto, MD  Anesthesia: General  Staff:  Circulator: Clide Cliff, RN Scrub Person: Janace Aris, RN  Estimated Blood Loss: Minimal               Specimens: sent to path  Indications: This is a 72 year old female was found to have a small area of calcifications in the left breast on screening mammography.  She underwent a stereotactic biopsy showing a complex sclerosing lesion.  The decision was made to proceed with a lumpectomy to remove this area for complete histologic evaluation to rule out a malignancy   Procedure: The patient was brought to the operating identified as a correct patient.  She was placed upon on the operating room table and general anesthesia was induced.  Her left breast was prepped and draped in usual sterile fashion.  Using the neoprobe I located the radioactive seed at the 9:45 position of the left breast medially.  I anesthetized skin over the area with Marcaine and made incision with a scalpel.  I then dissected down to the breast tissue with electrocautery.  Using the neoprobe I then stayed widely around the signal from the radioactive seed until I was posterior to the signal.  I then completed lumpectomy circumferentially with the cautery.  I then marked all margins with paint on the lumpectomy specimen.  An x-ray was performed of the specimen confirming that the original biopsy clip and radioactive seed were in the center of the specimen.  The specimen was then sent to pathology for evaluation.  I achieved hemostasis with the cautery.  I anesthetized the incision further with Marcaine.  I then closed the subcutaneous tissue with interrupted 3-0 Vicryl sutures and closed skin  with a running 4-0 Monocryl.  Dermabond was then applied.  The patient tolerated the procedure well.  All the counts were correct at the end of the procedure.  The patient was then extubated in the operating room and taken in a stable condition to the recovery room.          Abigail Miyamoto   Date: 04/10/2023  Time: 11:36 AM

## 2023-04-10 NOTE — Anesthesia Postprocedure Evaluation (Signed)
Anesthesia Post Note  Patient: Susan Blackburn  Procedure(s) Performed: LEFT BREAST LUMPECTOMY WITH RADIOACTIVE SEED LOCALIZATION (Left: Breast)     Patient location during evaluation: PACU Anesthesia Type: General Level of consciousness: awake and alert Pain management: pain level controlled Vital Signs Assessment: post-procedure vital signs reviewed and stable Respiratory status: spontaneous breathing, nonlabored ventilation and respiratory function stable Cardiovascular status: blood pressure returned to baseline and stable Postop Assessment: no apparent nausea or vomiting Anesthetic complications: no  No notable events documented.  Last Vitals:  Vitals:   04/10/23 1202 04/10/23 1229  BP: 126/66 (!) 151/74  Pulse: 72 72  Resp: 18 20  Temp: (!) 36.2 C (!) 36.2 C  SpO2: 95% 95%    Last Pain:  Vitals:   04/10/23 1229  TempSrc: Temporal  PainSc: 0-No pain                 Emilly Lavey,W. EDMOND

## 2023-04-10 NOTE — Anesthesia Procedure Notes (Signed)
Procedure Name: LMA Insertion Date/Time: 04/10/2023 11:02 AM  Performed by: Lauralyn Primes, CRNAPre-anesthesia Checklist: Patient identified, Emergency Drugs available, Suction available and Patient being monitored Patient Re-evaluated:Patient Re-evaluated prior to induction Oxygen Delivery Method: Circle system utilized Preoxygenation: Pre-oxygenation with 100% oxygen Induction Type: IV induction Ventilation: Mask ventilation without difficulty LMA: LMA inserted LMA Size: 4.0 Number of attempts: 1 Airway Equipment and Method: Bite block Placement Confirmation: positive ETCO2 Tube secured with: Tape Dental Injury: Teeth and Oropharynx as per pre-operative assessment

## 2023-04-10 NOTE — Transfer of Care (Signed)
Immediate Anesthesia Transfer of Care Note  Patient: Susan Blackburn  Procedure(s) Performed: LEFT BREAST LUMPECTOMY WITH RADIOACTIVE SEED LOCALIZATION (Left: Breast)  Patient Location: PACU  Anesthesia Type:General  Level of Consciousness: drowsy  Airway & Oxygen Therapy: Patient Spontanous Breathing and Patient connected to face mask oxygen  Post-op Assessment: Report given to RN and Post -op Vital signs reviewed and stable  Post vital signs: Reviewed and stable  Last Vitals:  Vitals Value Taken Time  BP 136/67 04/10/23 1141  Temp    Pulse 73 04/10/23 1143  Resp 24 04/10/23 1143  SpO2 100 % 04/10/23 1143  Vitals shown include unvalidated device data.  Last Pain:  Vitals:   04/10/23 1018  TempSrc: Oral  PainSc: 0-No pain      Patients Stated Pain Goal: 4 (04/10/23 1018)  Complications: No notable events documented.

## 2023-04-10 NOTE — Discharge Instructions (Addendum)
[facility] Office Phone Number [phone]  BREAST BIOPSY/ PARTIAL MASTECTOMY: POST OP INSTRUCTIONS  Always review your discharge instruction sheet given to you by the facility where your surgery was performed.  IF YOU HAVE DISABILITY OR FAMILY LEAVE FORMS, YOU MUST BRING THEM TO THE OFFICE FOR PROCESSING.  DO NOT GIVE THEM TO YOUR DOCTOR.  A prescription for pain medication may be given to you upon discharge.  Take your pain medication as prescribed, if needed.  If narcotic pain medicine is not needed, then you may take acetaminophen (Tylenol) or ibuprofen (Advil) as needed. Take your usually prescribed medications unless otherwise directed If you need a refill on your pain medication, please contact your pharmacy.  They will contact our office to request authorization.  Prescriptions will not be filled after 5pm or on week-ends. You should eat very light the first 24 hours after surgery, such as soup, crackers, pudding, etc.  Resume your normal diet the day after surgery. Most patients will experience some swelling and bruising in the breast.  Ice packs and a good support bra will help.  Swelling and bruising can take several days to resolve.  It is common to experience some constipation if taking pain medication after surgery.  Increasing fluid intake and taking a stool softener will usually help or prevent this problem from occurring.  A mild laxative (Milk of Magnesia or Miralax) should be taken according to package directions if there are no bowel movements after 48 hours. Unless discharge instructions indicate otherwise, you may remove your bandages 24-48 hours after surgery, and you may shower at that time.  You may have steri-strips (small skin tapes) in place directly over the incision.  These strips should be left on the skin for 7-10 days.  If your surgeon used skin glue on the incision, you may shower in 24 hours.  The glue will flake off over the next 2-3 weeks.  Any  sutures or staples will be removed at the office during your follow-up visit. ACTIVITIES:  You may resume regular daily activities (gradually increasing) beginning the next day.  Wearing a good support bra or sports bra minimizes pain and swelling.  You may have sexual intercourse when it is comfortable. You may drive when you no longer are taking prescription pain medication, you can comfortably wear a seatbelt, and you can safely maneuver your car and apply brakes. RETURN TO WORK:  ______________________________________________________________________________________ Susan Blackburn should see your doctor in the office for a follow-up appointment approximately two weeks after your surgery.  Your doctor's nurse will typically make your follow-up appointment when she calls you with your pathology report.  Expect your pathology report 2-3 business days after your surgery.  You may call to check if you do not hear from [place] after three days. OTHER INSTRUCTIONS: _YOU MAY SHOWER STARTING TOMORROW ICE PACK, TYLENOL, AND IBUPROFEN ALSO FOR PAIN NO VIGOROUS ACTIVITY FOR ONE WEEK ______________________________________________________________________________________________ _____________________________________________________________________________________________________________________________________ _____________________________________________________________________________________________________________________________________ _____________________________________________________________________________________________________________________________________  WHEN TO CALL YOUR DOCTOR: Fever over 101.0 Nausea and/or vomiting. Extreme swelling or bruising. Continued bleeding from incision. Increased pain, redness, or drainage from the incision.  The clinic staff is available to answer your questions during regular business hours.  Please don't hesitate to call and ask to speak to one of the nurses for clinical  concerns.  If you have a medical emergency, go to the nearest emergency room or call 911.  A surgeon from [facility] is always on call at the hospital.  For further questions, please visit  [email or web address]      Post Anesthesia Home Care Instructions  Activity: Get plenty of rest for the remainder of the day. A responsible individual must stay with you for 24 hours following the procedure.  For the next 24 hours, DO NOT: -Drive a car -Advertising copywriter -Drink alcoholic beverages -Take any medication unless instructed by your physician -Make any legal decisions or sign important papers.  Meals: Start with liquid foods such as gelatin or soup. Progress to regular foods as tolerated. Avoid greasy, spicy, heavy foods. If nausea and/or vomiting occur, drink only clear liquids until the nausea and/or vomiting subsides. Call your physician if vomiting continues.  Special Instructions/Symptoms: Your throat may feel dry or sore from the anesthesia or the breathing tube placed in your throat during surgery. If this causes discomfort, gargle with warm salt water. The discomfort should disappear within 24 hours.  If you had a scopolamine patch placed behind your ear for the management of post- operative nausea and/or vomiting:  1. The medication in the patch is effective for 72 hours, after which it should be removed.  Wrap patch in a tissue and discard in the trash. Wash hands thoroughly with soap and water. 2. You may remove the patch earlier than 72 hours if you experience unpleasant side effects which may include dry mouth, dizziness or visual disturbances. 3. Avoid touching the patch. Wash your hands with soap and water after contact with the patch.    Next dose of tylenol due after 4:30pm

## 2023-04-10 NOTE — Interval H&P Note (Signed)
History and Physical Interval Note:no change in H and P  04/10/2023 10:49 AM  Susan Blackburn  has presented today for surgery, with the diagnosis of LEFT BREAST COMPLEX SCLEROSING LESION.  The various methods of treatment have been discussed with the patient and family. After consideration of risks, benefits and other options for treatment, the patient has consented to  Procedure(s): LEFT BREAST LUMPECTOMY WITH RADIOACTIVE SEED LOCALIZATION (Left) as a surgical intervention.  The patient's history has been reviewed, patient examined, no change in status, stable for surgery.  I have reviewed the patient's chart and labs.  Questions were answered to the patient's satisfaction.     Abigail Miyamoto

## 2023-04-11 ENCOUNTER — Encounter (HOSPITAL_BASED_OUTPATIENT_CLINIC_OR_DEPARTMENT_OTHER): Payer: Self-pay | Admitting: Surgery

## 2023-04-19 LAB — SURGICAL PATHOLOGY

## 2023-04-23 DIAGNOSIS — E1169 Type 2 diabetes mellitus with other specified complication: Secondary | ICD-10-CM | POA: Diagnosis not present

## 2023-04-26 DIAGNOSIS — E782 Mixed hyperlipidemia: Secondary | ICD-10-CM | POA: Diagnosis not present

## 2023-04-26 DIAGNOSIS — E1169 Type 2 diabetes mellitus with other specified complication: Secondary | ICD-10-CM | POA: Diagnosis not present

## 2023-04-26 DIAGNOSIS — R0683 Snoring: Secondary | ICD-10-CM | POA: Diagnosis not present

## 2023-04-26 DIAGNOSIS — Z6838 Body mass index (BMI) 38.0-38.9, adult: Secondary | ICD-10-CM | POA: Diagnosis not present

## 2023-04-26 DIAGNOSIS — Z7184 Encounter for health counseling related to travel: Secondary | ICD-10-CM | POA: Diagnosis not present

## 2023-05-16 DIAGNOSIS — R030 Elevated blood-pressure reading, without diagnosis of hypertension: Secondary | ICD-10-CM | POA: Diagnosis not present

## 2023-05-16 DIAGNOSIS — E668 Other obesity: Secondary | ICD-10-CM | POA: Diagnosis not present

## 2023-05-16 DIAGNOSIS — G4719 Other hypersomnia: Secondary | ICD-10-CM | POA: Diagnosis not present

## 2023-06-17 DIAGNOSIS — E668 Other obesity: Secondary | ICD-10-CM | POA: Diagnosis not present

## 2023-06-17 DIAGNOSIS — G4733 Obstructive sleep apnea (adult) (pediatric): Secondary | ICD-10-CM | POA: Diagnosis not present

## 2023-06-20 DIAGNOSIS — B353 Tinea pedis: Secondary | ICD-10-CM | POA: Diagnosis not present

## 2023-06-20 DIAGNOSIS — E119 Type 2 diabetes mellitus without complications: Secondary | ICD-10-CM | POA: Diagnosis not present

## 2023-06-20 DIAGNOSIS — B351 Tinea unguium: Secondary | ICD-10-CM | POA: Diagnosis not present

## 2023-06-20 DIAGNOSIS — I70203 Unspecified atherosclerosis of native arteries of extremities, bilateral legs: Secondary | ICD-10-CM | POA: Diagnosis not present

## 2023-07-04 DIAGNOSIS — B351 Tinea unguium: Secondary | ICD-10-CM | POA: Diagnosis not present

## 2023-07-18 DIAGNOSIS — B351 Tinea unguium: Secondary | ICD-10-CM | POA: Diagnosis not present

## 2023-08-01 DIAGNOSIS — B351 Tinea unguium: Secondary | ICD-10-CM | POA: Diagnosis not present

## 2023-08-15 DIAGNOSIS — B351 Tinea unguium: Secondary | ICD-10-CM | POA: Diagnosis not present

## 2023-08-16 DIAGNOSIS — Z23 Encounter for immunization: Secondary | ICD-10-CM | POA: Diagnosis not present

## 2023-08-23 DIAGNOSIS — Z23 Encounter for immunization: Secondary | ICD-10-CM | POA: Diagnosis not present

## 2023-08-27 DIAGNOSIS — B351 Tinea unguium: Secondary | ICD-10-CM | POA: Diagnosis not present

## 2023-09-16 DIAGNOSIS — E6689 Other obesity not elsewhere classified: Secondary | ICD-10-CM | POA: Diagnosis not present

## 2023-09-16 DIAGNOSIS — G4733 Obstructive sleep apnea (adult) (pediatric): Secondary | ICD-10-CM | POA: Diagnosis not present

## 2023-11-11 DIAGNOSIS — E1169 Type 2 diabetes mellitus with other specified complication: Secondary | ICD-10-CM | POA: Diagnosis not present

## 2023-11-11 DIAGNOSIS — E782 Mixed hyperlipidemia: Secondary | ICD-10-CM | POA: Diagnosis not present

## 2023-11-11 DIAGNOSIS — Z79899 Other long term (current) drug therapy: Secondary | ICD-10-CM | POA: Diagnosis not present

## 2023-11-15 DIAGNOSIS — M8588 Other specified disorders of bone density and structure, other site: Secondary | ICD-10-CM | POA: Diagnosis not present

## 2023-11-15 DIAGNOSIS — E66812 Obesity, class 2: Secondary | ICD-10-CM | POA: Diagnosis not present

## 2023-11-15 DIAGNOSIS — E119 Type 2 diabetes mellitus without complications: Secondary | ICD-10-CM | POA: Diagnosis not present

## 2023-11-15 DIAGNOSIS — Z23 Encounter for immunization: Secondary | ICD-10-CM | POA: Diagnosis not present

## 2023-11-15 DIAGNOSIS — E782 Mixed hyperlipidemia: Secondary | ICD-10-CM | POA: Diagnosis not present

## 2023-11-15 DIAGNOSIS — Z0000 Encounter for general adult medical examination without abnormal findings: Secondary | ICD-10-CM | POA: Diagnosis not present

## 2023-11-15 DIAGNOSIS — Z6838 Body mass index (BMI) 38.0-38.9, adult: Secondary | ICD-10-CM | POA: Diagnosis not present

## 2024-02-13 DIAGNOSIS — H2511 Age-related nuclear cataract, right eye: Secondary | ICD-10-CM | POA: Diagnosis not present

## 2024-02-13 DIAGNOSIS — Z961 Presence of intraocular lens: Secondary | ICD-10-CM | POA: Diagnosis not present

## 2024-02-13 DIAGNOSIS — H5213 Myopia, bilateral: Secondary | ICD-10-CM | POA: Diagnosis not present

## 2024-02-13 DIAGNOSIS — E119 Type 2 diabetes mellitus without complications: Secondary | ICD-10-CM | POA: Diagnosis not present

## 2024-02-25 DIAGNOSIS — B353 Tinea pedis: Secondary | ICD-10-CM | POA: Diagnosis not present

## 2024-02-25 DIAGNOSIS — E119 Type 2 diabetes mellitus without complications: Secondary | ICD-10-CM | POA: Diagnosis not present

## 2024-02-25 DIAGNOSIS — B351 Tinea unguium: Secondary | ICD-10-CM | POA: Diagnosis not present

## 2024-03-06 DIAGNOSIS — E119 Type 2 diabetes mellitus without complications: Secondary | ICD-10-CM | POA: Diagnosis not present

## 2024-03-06 DIAGNOSIS — E1169 Type 2 diabetes mellitus with other specified complication: Secondary | ICD-10-CM | POA: Diagnosis not present

## 2024-03-16 DIAGNOSIS — G4733 Obstructive sleep apnea (adult) (pediatric): Secondary | ICD-10-CM | POA: Diagnosis not present

## 2024-03-25 DIAGNOSIS — E119 Type 2 diabetes mellitus without complications: Secondary | ICD-10-CM | POA: Diagnosis not present

## 2024-03-25 DIAGNOSIS — E1169 Type 2 diabetes mellitus with other specified complication: Secondary | ICD-10-CM | POA: Diagnosis not present

## 2024-03-25 DIAGNOSIS — E782 Mixed hyperlipidemia: Secondary | ICD-10-CM | POA: Diagnosis not present

## 2024-04-04 DIAGNOSIS — E1169 Type 2 diabetes mellitus with other specified complication: Secondary | ICD-10-CM | POA: Diagnosis not present

## 2024-04-04 DIAGNOSIS — E119 Type 2 diabetes mellitus without complications: Secondary | ICD-10-CM | POA: Diagnosis not present

## 2024-04-25 DIAGNOSIS — E782 Mixed hyperlipidemia: Secondary | ICD-10-CM | POA: Diagnosis not present

## 2024-04-25 DIAGNOSIS — E1169 Type 2 diabetes mellitus with other specified complication: Secondary | ICD-10-CM | POA: Diagnosis not present

## 2024-04-25 DIAGNOSIS — E119 Type 2 diabetes mellitus without complications: Secondary | ICD-10-CM | POA: Diagnosis not present

## 2024-05-04 DIAGNOSIS — E1169 Type 2 diabetes mellitus with other specified complication: Secondary | ICD-10-CM | POA: Diagnosis not present

## 2024-05-04 DIAGNOSIS — E119 Type 2 diabetes mellitus without complications: Secondary | ICD-10-CM | POA: Diagnosis not present

## 2024-05-21 DIAGNOSIS — E782 Mixed hyperlipidemia: Secondary | ICD-10-CM | POA: Diagnosis not present

## 2024-05-21 DIAGNOSIS — E119 Type 2 diabetes mellitus without complications: Secondary | ICD-10-CM | POA: Diagnosis not present

## 2024-05-21 DIAGNOSIS — Z23 Encounter for immunization: Secondary | ICD-10-CM | POA: Diagnosis not present

## 2024-05-21 DIAGNOSIS — Z6837 Body mass index (BMI) 37.0-37.9, adult: Secondary | ICD-10-CM | POA: Diagnosis not present

## 2024-06-03 DIAGNOSIS — E1169 Type 2 diabetes mellitus with other specified complication: Secondary | ICD-10-CM | POA: Diagnosis not present

## 2024-06-03 DIAGNOSIS — E119 Type 2 diabetes mellitus without complications: Secondary | ICD-10-CM | POA: Diagnosis not present

## 2024-06-25 DIAGNOSIS — E782 Mixed hyperlipidemia: Secondary | ICD-10-CM | POA: Diagnosis not present

## 2024-06-25 DIAGNOSIS — E1169 Type 2 diabetes mellitus with other specified complication: Secondary | ICD-10-CM | POA: Diagnosis not present

## 2024-06-25 DIAGNOSIS — E119 Type 2 diabetes mellitus without complications: Secondary | ICD-10-CM | POA: Diagnosis not present

## 2024-07-03 DIAGNOSIS — E1169 Type 2 diabetes mellitus with other specified complication: Secondary | ICD-10-CM | POA: Diagnosis not present

## 2024-07-03 DIAGNOSIS — E119 Type 2 diabetes mellitus without complications: Secondary | ICD-10-CM | POA: Diagnosis not present

## 2024-07-13 DIAGNOSIS — B353 Tinea pedis: Secondary | ICD-10-CM | POA: Diagnosis not present

## 2024-07-13 DIAGNOSIS — B351 Tinea unguium: Secondary | ICD-10-CM | POA: Diagnosis not present

## 2024-07-13 DIAGNOSIS — E119 Type 2 diabetes mellitus without complications: Secondary | ICD-10-CM | POA: Diagnosis not present

## 2024-07-26 DIAGNOSIS — E782 Mixed hyperlipidemia: Secondary | ICD-10-CM | POA: Diagnosis not present

## 2024-07-26 DIAGNOSIS — E1169 Type 2 diabetes mellitus with other specified complication: Secondary | ICD-10-CM | POA: Diagnosis not present

## 2024-07-26 DIAGNOSIS — E119 Type 2 diabetes mellitus without complications: Secondary | ICD-10-CM | POA: Diagnosis not present

## 2024-08-02 DIAGNOSIS — E119 Type 2 diabetes mellitus without complications: Secondary | ICD-10-CM | POA: Diagnosis not present

## 2024-08-02 DIAGNOSIS — E1169 Type 2 diabetes mellitus with other specified complication: Secondary | ICD-10-CM | POA: Diagnosis not present

## 2024-08-25 DIAGNOSIS — E119 Type 2 diabetes mellitus without complications: Secondary | ICD-10-CM | POA: Diagnosis not present

## 2024-08-25 DIAGNOSIS — E1169 Type 2 diabetes mellitus with other specified complication: Secondary | ICD-10-CM | POA: Diagnosis not present

## 2024-08-25 DIAGNOSIS — E782 Mixed hyperlipidemia: Secondary | ICD-10-CM | POA: Diagnosis not present

## 2024-09-01 DIAGNOSIS — E119 Type 2 diabetes mellitus without complications: Secondary | ICD-10-CM | POA: Diagnosis not present

## 2024-09-01 DIAGNOSIS — E1169 Type 2 diabetes mellitus with other specified complication: Secondary | ICD-10-CM | POA: Diagnosis not present

## 2024-09-25 DIAGNOSIS — E1169 Type 2 diabetes mellitus with other specified complication: Secondary | ICD-10-CM | POA: Diagnosis not present

## 2024-09-25 DIAGNOSIS — E782 Mixed hyperlipidemia: Secondary | ICD-10-CM | POA: Diagnosis not present

## 2024-09-25 DIAGNOSIS — E119 Type 2 diabetes mellitus without complications: Secondary | ICD-10-CM | POA: Diagnosis not present

## 2024-10-01 DIAGNOSIS — E119 Type 2 diabetes mellitus without complications: Secondary | ICD-10-CM | POA: Diagnosis not present

## 2024-10-01 DIAGNOSIS — E1169 Type 2 diabetes mellitus with other specified complication: Secondary | ICD-10-CM | POA: Diagnosis not present

## 2024-10-07 DIAGNOSIS — Z23 Encounter for immunization: Secondary | ICD-10-CM | POA: Diagnosis not present

## 2024-10-25 DIAGNOSIS — E782 Mixed hyperlipidemia: Secondary | ICD-10-CM | POA: Diagnosis not present

## 2024-10-25 DIAGNOSIS — E1169 Type 2 diabetes mellitus with other specified complication: Secondary | ICD-10-CM | POA: Diagnosis not present

## 2024-10-25 DIAGNOSIS — E119 Type 2 diabetes mellitus without complications: Secondary | ICD-10-CM | POA: Diagnosis not present
